# Patient Record
Sex: Male | Born: 1960 | Race: White | Hispanic: No | Marital: Married | Smoking: Current every day smoker
Health system: Southern US, Community
[De-identification: ages and names within clinical notes are randomized; demographics above are authoritative.]

## PROBLEM LIST (undated history)

## (undated) DIAGNOSIS — E559 Vitamin D deficiency, unspecified: Secondary | ICD-10-CM

## (undated) DIAGNOSIS — E7800 Pure hypercholesterolemia, unspecified: Secondary | ICD-10-CM

## (undated) DIAGNOSIS — F419 Anxiety disorder, unspecified: Secondary | ICD-10-CM

## (undated) DIAGNOSIS — S86019A Strain of unspecified Achilles tendon, initial encounter: Secondary | ICD-10-CM

## (undated) DIAGNOSIS — K219 Gastro-esophageal reflux disease without esophagitis: Secondary | ICD-10-CM

## (undated) DIAGNOSIS — J302 Other seasonal allergic rhinitis: Secondary | ICD-10-CM

## (undated) DIAGNOSIS — G4730 Sleep apnea, unspecified: Secondary | ICD-10-CM

---
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
## 2011-03-04 HISTORY — PX: TUMOR REMOVAL: SHX12

## 2011-03-04 HISTORY — PX: ESOPHAGOGASTRODUODENOSCOPY ENDOSCOPY: SHX5814

## 2013-01-01 HISTORY — PX: COLONOSCOPY: SHX174

## 2013-01-31 DIAGNOSIS — S86019A Strain of unspecified Achilles tendon, initial encounter: Secondary | ICD-10-CM

## 2013-01-31 HISTORY — DX: Strain of unspecified achilles tendon, initial encounter: S86.019A

## 2013-01-31 HISTORY — PX: WISDOM TOOTH EXTRACTION: SHX21

## 2013-03-01 ENCOUNTER — Encounter (HOSPITAL_COMMUNITY): Payer: Self-pay | Admitting: Pharmacy Technician

## 2013-03-01 ENCOUNTER — Other Ambulatory Visit (HOSPITAL_COMMUNITY): Payer: Self-pay

## 2013-03-01 NOTE — Progress Notes (Signed)
LOV note 02/26/13 Bulla PA-C on chart, sleep test 12/14/11 on chart

## 2013-03-01 NOTE — Patient Instructions (Addendum)
20 Coulton Schlink  03/01/2013   Your procedure is scheduled on: 03/04/13  Report to [facility] at 10:00 AM.  Call this number if you have problems the morning of surgery [phone]   Remember:   Do not eat food or drink liquids After Midnight.     Take these medicines the morning of surgery with A SIP OF WATER: omeprazole, lorazepam if needed, augmentin, flonase   Do not wear jewelry, make-up or nail polish.  Do not wear lotions, powders, or perfumes. You may wear deodorant.  Do not shave 48 hours prior to surgery. Men may shave face and neck.  Do not bring valuables to the hospital.  Contacts, dentures or bridgework may not be worn into surgery.  Leave suitcase in the car. After surgery it may be brought to your room.  For patients admitted to the hospital, checkout time is 11:00 AM the day of discharge.    Please read over the following fact sheets that you were given: incentive spirometry fact sheet Birdie Sons, RN  pre op nurse call if needed [phone]    FAILURE TO FOLLOW THESE INSTRUCTIONS MAY RESULT IN CANCELLATION OF YOUR SURGERY   Patient Signature: ___________________________________________

## 2013-03-02 ENCOUNTER — Encounter (HOSPITAL_COMMUNITY)
Admission: RE | Admit: 2013-03-02 | Discharge: 2013-03-02 | Disposition: A | Discharge status: Home / Self Care | Payer: BC Managed Care – PPO | Source: Ambulatory Visit | Attending: Orthopedic Surgery | Admitting: Orthopedic Surgery

## 2013-03-02 ENCOUNTER — Encounter (HOSPITAL_COMMUNITY): Payer: Self-pay

## 2013-03-02 ENCOUNTER — Ambulatory Visit (HOSPITAL_COMMUNITY)
Admission: RE | Admit: 2013-03-02 | Discharge: 2013-03-02 | Disposition: A | Discharge status: Home / Self Care | Payer: BC Managed Care – PPO | Source: Ambulatory Visit | Attending: Surgical | Admitting: Surgical

## 2013-03-02 DIAGNOSIS — Z01812 Encounter for preprocedural laboratory examination: Secondary | ICD-10-CM | POA: Insufficient documentation

## 2013-03-02 DIAGNOSIS — Z01810 Encounter for preprocedural cardiovascular examination: Secondary | ICD-10-CM | POA: Insufficient documentation

## 2013-03-02 DIAGNOSIS — IMO0002 Reserved for concepts with insufficient information to code with codable children: Secondary | ICD-10-CM | POA: Insufficient documentation

## 2013-03-02 DIAGNOSIS — Z01818 Encounter for other preprocedural examination: Secondary | ICD-10-CM | POA: Insufficient documentation

## 2013-03-02 HISTORY — DX: Sleep apnea, unspecified: G47.30

## 2013-03-02 HISTORY — DX: Strain of unspecified achilles tendon, initial encounter: S86.019A

## 2013-03-02 HISTORY — DX: Vitamin D deficiency, unspecified: E55.9

## 2013-03-02 HISTORY — DX: Anxiety disorder, unspecified: F41.9

## 2013-03-02 HISTORY — DX: Pure hypercholesterolemia, unspecified: E78.00

## 2013-03-02 HISTORY — DX: Gastro-esophageal reflux disease without esophagitis: K21.9

## 2013-03-02 HISTORY — DX: Other seasonal allergic rhinitis: J30.2

## 2013-03-02 LAB — URINALYSIS, ROUTINE W REFLEX MICROSCOPIC
Bilirubin Urine: NEGATIVE
Glucose, UA: NEGATIVE mg/dL
Hgb urine dipstick: NEGATIVE
Ketones, ur: NEGATIVE mg/dL
Leukocytes, UA: NEGATIVE
Nitrite: NEGATIVE
Protein, ur: NEGATIVE mg/dL
Specific Gravity, Urine: 1.007 (ref 1.005–1.030)
Urobilinogen, UA: 0.2 mg/dL (ref 0.0–1.0)
pH: 6 (ref 5.0–8.0)

## 2013-03-02 LAB — CBC
HCT: 46.8 % (ref 39.0–52.0)
MCH: 31.5 pg (ref 26.0–34.0)
MCHC: 33.3 g/dL (ref 30.0–36.0)
MCV: 94.4 fL (ref 78.0–100.0)
Platelets: 216 10*3/uL (ref 150–400)
RBC: 4.96 MIL/uL (ref 4.22–5.81)
RDW: 14.1 % (ref 11.5–15.5)
WBC: 9.2 10*3/uL (ref 4.0–10.5)

## 2013-03-02 LAB — COMPREHENSIVE METABOLIC PANEL
ALT: 49 U/L (ref 0–53)
AST: 25 U/L (ref 0–37)
Albumin: 3.5 g/dL (ref 3.5–5.2)
Alkaline Phosphatase: 100 U/L (ref 39–117)
BUN: 12 mg/dL (ref 6–23)
CO2: 27 mEq/L (ref 19–32)
Calcium: 9.3 mg/dL (ref 8.4–10.5)
Chloride: 101 mEq/L (ref 96–112)
Creatinine, Ser: 0.82 mg/dL (ref 0.50–1.35)
GFR calc Af Amer: >90 mL/min (ref >90)
GFR calc non Af Amer: >90 mL/min (ref >90)
Glucose, Bld: 115 mg/dL — ABNORMAL HIGH (ref 70–99)
Potassium: 4.3 mEq/L (ref 3.7–5.3)
Sodium: 138 mEq/L (ref 137–147)
Total Bilirubin: 0.3 mg/dL (ref 0.3–1.2)
Total Protein: 7.1 g/dL (ref 6.0–8.3)

## 2013-03-02 LAB — PROTIME-INR
INR: 0.93 (ref 0.00–1.49)
Prothrombin Time: 12.3 seconds (ref 11.6–15.2)

## 2013-03-02 LAB — APTT: aPTT: 28 seconds (ref 24–37)

## 2013-03-02 NOTE — H&P (Signed)
Bryan Orozco is an 52 y.o. male.   Chief Complaint: left ankle pain HPI: Bryan Orozco is a 52 year old male who presented with the chief complaint of left ankle pain. He reports that he starting having pain 2 weeks ago after taking a fall off of steps. He has noticed significant pain and swelling over the left ankle. He has increased discomfort with weightbearing. X-rays showed avulsion fracture off the medial malleolus and clinical exam showed a clear Achilles tendon rupture.   Past Medical History  Diagnosis Date  . Seasonal allergies   . Vitamin D deficiency   . High cholesterol   . Anxiety   . Sleep apnea     severe   . GERD (gastroesophageal reflux disease)   . Achilles tendon rupture 01/2013    with heel fracture, left    Past Surgical History  Procedure Laterality Date  . Tonsillectomy  1972  . Wisdom tooth extraction  01/2013    with bone graft  . Knee surgery Left 1979    torn ligament repair  . 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 Esophagogastroduodenoscopy endoscopy  2013  . Colonoscopy  01/2013   Social History:  reports that he has been smoking Cigarettes.  He has a 35 pack-year smoking history. He has never used smokeless tobacco. He reports that he drinks alcohol. He reports that he does not use illicit drugs.  Allergies: No Known Allergies  No prescriptions prior to admission    Results for orders placed during the hospital encounter of 03/02/13 (from the past 48 hour(s))  URINALYSIS, ROUTINE W REFLEX MICROSCOPIC     Status: None   Collection Time    03/02/13  8:56 AM      Result Value Range   Color, Urine YELLOW  YELLOW   APPearance CLEAR  CLEAR   Specific Gravity, Urine 1.007  1.005 - 1.030   pH 6.0  5.0 - 8.0   Glucose, UA NEGATIVE  NEGATIVE mg/dL   Hgb urine dipstick NEGATIVE  NEGATIVE   Bilirubin Urine NEGATIVE  NEGATIVE   Ketones, ur NEGATIVE  NEGATIVE mg/dL   Protein, ur NEGATIVE  NEGATIVE mg/dL   Urobilinogen, UA 0.2  0.0 - 1.0 mg/dL   Nitrite  NEGATIVE  NEGATIVE   Leukocytes, UA NEGATIVE  NEGATIVE   Comment: MICROSCOPIC NOT DONE ON URINES WITH NEGATIVE PROTEIN, BLOOD, LEUKOCYTES, NITRITE, OR GLUCOSE <1000 mg/dL.  CBC     Status: None   Collection Time    03/02/13  9:30 AM      Result Value Range   WBC 9.2  4.0 - 10.5 K/uL   RBC 4.96  4.22 - 5.81 MIL/uL   Hemoglobin 15.6  13.0 - 17.0 g/dL   HCT 16.1  09.6 - 04.5 %   MCV 94.4  78.0 - 100.0 fL   MCH 31.5  26.0 - 34.0 pg   MCHC 33.3  30.0 - 36.0 g/dL   RDW 40.9  81.1 - 91.4 %   Platelets 216  150 - 400 K/uL  APTT     Status: None   Collection Time    03/02/13  9:30 AM      Result Value Range   aPTT 28  24 - 37 seconds  COMPREHENSIVE METABOLIC PANEL     Status: Abnormal   Collection Time    03/02/13  9:30 AM      Result Value Range   Sodium 138  137 - 147 mEq/L  Comment: Please note change in reference range.   Potassium 4.3  3.7 - 5.3 mEq/L   Comment: Please note change in reference range.   Chloride 101  96 - 112 mEq/L   CO2 27  19 - 32 mEq/L   Glucose, Bld 115 (*) 70 - 99 mg/dL   BUN 12  6 - 23 mg/dL   Creatinine, Ser 2.95  0.50 - 1.35 mg/dL   Calcium 9.3  8.4 - 62.1 mg/dL   Total Protein 7.1  6.0 - 8.3 g/dL   Albumin 3.5  3.5 - 5.2 g/dL   AST 25  0 - 37 U/L   ALT 49  0 - 53 U/L   Alkaline Phosphatase 100  39 - 117 U/L   Total Bilirubin 0.3  0.3 - 1.2 mg/dL   GFR calc non Af Amer >90  >90 mL/min   GFR calc Af Amer >90  >90 mL/min   Comment: (NOTE)     The eGFR has been calculated using the CKD EPI equation.     This calculation has not been validated in all clinical situations.     eGFR's persistently <90 mL/min signify possible Chronic Kidney     Disease.  PROTIME-INR     Status: None   Collection Time    03/02/13  9:30 AM      Result Value Range   Prothrombin Time 12.3  11.6 - 15.2 seconds   INR 0.93  0.00 - 1.49   Dg Chest 2 View  03/02/2013   CLINICAL DATA:  Preoperative examination (Achilles tendon surgery)  EXAM: CHEST  2 VIEW  COMPARISON:   None.  FINDINGS: Normal cardiac silhouette and mediastinal contours. Apparent mild lung hyperexpansion may be artifactual secondary to mildly accentuated thoracic kyphosis. Minimal bilateral infrahilar opacities favored to represent atelectasis. No discrete focal airspace opacities. No definite pleural effusion or pneumothorax. No definite evidence of edema. Mild-to-moderate multilevel DDD within the mid and caudal aspect of the thoracic spine.  IMPRESSION: Minimal perihilar atelectasis without acute cardiopulmonary disease.   Electronically Signed   By: Simonne Come M.D.   On: 03/02/2013 09:49    Review of Systems  Constitutional: Negative.   HENT: Negative.   Eyes: Negative.   Respiratory: Negative.   Cardiovascular: Negative.   Gastrointestinal: Negative.   Genitourinary: Negative.   Musculoskeletal: Positive for falls, joint pain and myalgias. Negative for back pain and neck pain.  Skin: Negative.   Psychiatric/Behavioral: Negative.    Vitals Weight: 260 lb Height: 72 in Body Surface Area: 2.45 m Body Mass Index: 35.26 kg/m Pulse: 95 (Regular) BP: 150/90 (Sitting, Left Arm, Standard)  Physical Exam  Constitutional: He is oriented to person, place, and time. He appears well-developed and well-nourished. No distress.  HENT:  Head: Normocephalic and atraumatic.  Right Ear: External ear normal.  Left Ear: External ear normal.  Nose: Nose normal.  Mouth/Throat: Oropharynx is clear and moist.  Eyes: Conjunctivae and EOM are normal.  Neck: Normal range of motion. Neck supple.  Cardiovascular: Normal rate, regular rhythm, normal heart sounds and intact distal pulses.   Respiratory: Effort normal and breath sounds normal.  GI: Soft. Bowel sounds are normal.  Musculoskeletal:       Right knee: Normal.       Left knee: Normal.       Right ankle: Normal.       Left ankle: He exhibits decreased range of motion and swelling. Tenderness. Medial malleolus tenderness found. No  lateral malleolus tenderness  found. Achilles tendon exhibits pain, defect and abnormal Thompson's test results.       Right lower leg: He exhibits no tenderness and no swelling.       Left lower leg: He exhibits no tenderness and no swelling.  Neurological: He is alert and oriented to person, place, and time. No sensory deficit.  Skin: No rash noted. He is not diaphoretic. No erythema.  Psychiatric: He has a normal mood and affect. His behavior is normal.     Assessment/Plan Left Achilles tendon rupture He needs an open left Achilles tendon repair with possible use of graft and anchors. Risks and benefits of the procedure were discussed with the patient by Dr. Darrelyn Hillock.    Tamitha Norell LAUREN 03/02/2013, 1:37 PM

## 2013-03-04 ENCOUNTER — Encounter (HOSPITAL_COMMUNITY)
Admission: RE | Disposition: A | Discharge status: Home / Self Care | Payer: Self-pay | Source: Ambulatory Visit | Attending: Orthopedic Surgery

## 2013-03-04 ENCOUNTER — Encounter (HOSPITAL_COMMUNITY): Payer: Self-pay | Admitting: *Deleted

## 2013-03-04 ENCOUNTER — Ambulatory Visit (HOSPITAL_COMMUNITY): Payer: BC Managed Care – PPO | Admitting: Anesthesiology

## 2013-03-04 ENCOUNTER — Observation Stay (HOSPITAL_COMMUNITY)
Admission: RE | Admit: 2013-03-04 | Discharge: 2013-03-05 | Disposition: A | Discharge status: Home / Self Care | Payer: BC Managed Care – PPO | Source: Ambulatory Visit | Attending: Orthopedic Surgery | Admitting: Orthopedic Surgery

## 2013-03-04 ENCOUNTER — Encounter (HOSPITAL_COMMUNITY): Payer: BC Managed Care – PPO | Admitting: Anesthesiology

## 2013-03-04 DIAGNOSIS — K219 Gastro-esophageal reflux disease without esophagitis: Secondary | ICD-10-CM | POA: Insufficient documentation

## 2013-03-04 DIAGNOSIS — S96819A Strain of other specified muscles and tendons at ankle and foot level, unspecified foot, initial encounter: Principal | ICD-10-CM

## 2013-03-04 DIAGNOSIS — S93499A Sprain of other ligament of unspecified ankle, initial encounter: Principal | ICD-10-CM | POA: Insufficient documentation

## 2013-03-04 DIAGNOSIS — S86009A Unspecified injury of unspecified Achilles tendon, initial encounter: Secondary | ICD-10-CM

## 2013-03-04 DIAGNOSIS — X58XXXA Exposure to other specified factors, initial encounter: Secondary | ICD-10-CM | POA: Insufficient documentation

## 2013-03-04 DIAGNOSIS — S86002A Unspecified injury of left Achilles tendon, initial encounter: Secondary | ICD-10-CM

## 2013-03-04 DIAGNOSIS — F17200 Nicotine dependence, unspecified, uncomplicated: Secondary | ICD-10-CM | POA: Insufficient documentation

## 2013-03-04 DIAGNOSIS — G4730 Sleep apnea, unspecified: Secondary | ICD-10-CM | POA: Insufficient documentation

## 2013-03-04 HISTORY — PX: ACHILLES TENDON SURGERY: SHX542

## 2013-03-04 SURGERY — REPAIR, TENDON, ACHILLES
Anesthesia: General | Site: Leg Lower | Laterality: Left

## 2013-03-04 MED ORDER — MIDAZOLAM HCL 5 MG/5ML IJ SOLN
INTRAMUSCULAR | Status: DC | PRN
  Administered 2013-03-04: 2 mg via INTRAVENOUS

## 2013-03-04 MED ORDER — METHOCARBAMOL 100 MG/ML IJ SOLN
500.0000 mg | Freq: Four times a day (QID) | INTRAVENOUS | Status: DC | PRN
  Administered 2013-03-04: 500 mg via INTRAVENOUS
  Filled 2013-03-04: qty 5

## 2013-03-04 MED ORDER — HYDROMORPHONE HCL PF 1 MG/ML IJ SOLN: 0.5000 mg | INTRAMUSCULAR | Status: DC | PRN

## 2013-03-04 MED ORDER — METOPROLOL TARTRATE 1 MG/ML IV SOLN
INTRAVENOUS | Status: DC | PRN
  Administered 2013-03-04 (×4): 1 mg via INTRAVENOUS

## 2013-03-04 MED ORDER — BISACODYL 10 MG RE SUPP: 10.0000 mg | Freq: Every day | RECTAL | Status: DC | PRN

## 2013-03-04 MED ORDER — FERROUS SULFATE 325 (65 FE) MG PO TABS
325.0000 mg | ORAL_TABLET | Freq: Three times a day (TID) | ORAL | Status: DC
  Administered 2013-03-04 – 2013-03-05 (×3): 325 mg via ORAL
  Filled 2013-03-04 (×5): qty 1

## 2013-03-04 MED ORDER — PROPOFOL 10 MG/ML IV BOLUS
INTRAVENOUS | Status: AC
  Filled 2013-03-04: qty 20

## 2013-03-04 MED ORDER — HYDROMORPHONE HCL PF 1 MG/ML IJ SOLN
INTRAMUSCULAR | Status: AC
  Filled 2013-03-04: qty 1

## 2013-03-04 MED ORDER — BACITRACIN-NEOMYCIN-POLYMYXIN 400-5-5000 EX OINT
TOPICAL_OINTMENT | CUTANEOUS | Status: DC | PRN
  Administered 2013-03-04: 1 via TOPICAL

## 2013-03-04 MED ORDER — DEXAMETHASONE SODIUM PHOSPHATE 10 MG/ML IJ SOLN
INTRAMUSCULAR | Status: DC | PRN
  Administered 2013-03-04: 10 mg via INTRAVENOUS

## 2013-03-04 MED ORDER — LORAZEPAM 1 MG PO TABS: 1.0000 mg | ORAL_TABLET | Freq: Three times a day (TID) | ORAL | Status: DC | PRN

## 2013-03-04 MED ORDER — PANTOPRAZOLE SODIUM 40 MG PO TBEC
40.0000 mg | DELAYED_RELEASE_TABLET | Freq: Every day | ORAL | Status: DC
Start: 2013-03-04 — End: 2013-03-05
  Administered 2013-03-04 – 2013-03-05 (×2): 40 mg via ORAL
  Filled 2013-03-04 (×2): qty 1

## 2013-03-04 MED ORDER — HYDROMORPHONE HCL PF 1 MG/ML IJ SOLN
0.2500 mg | INTRAMUSCULAR | Status: DC | PRN
  Administered 2013-03-04 (×4): 0.5 mg via INTRAVENOUS

## 2013-03-04 MED ORDER — NEOSTIGMINE METHYLSULFATE 1 MG/ML IJ SOLN
INTRAMUSCULAR | Status: DC | PRN
  Administered 2013-03-04: 4 mg via INTRAVENOUS

## 2013-03-04 MED ORDER — CELECOXIB 200 MG PO CAPS
200.0000 mg | ORAL_CAPSULE | Freq: Two times a day (BID) | ORAL | Status: DC
  Administered 2013-03-04: 22:00:00 200 mg via ORAL
  Filled 2013-03-04 (×3): qty 1

## 2013-03-04 MED ORDER — CISATRACURIUM BESYLATE (PF) 10 MG/5ML IV SOLN
INTRAVENOUS | Status: DC | PRN
  Administered 2013-03-04: 5 mg via INTRAVENOUS

## 2013-03-04 MED ORDER — LACTATED RINGERS IV SOLN: INTRAVENOUS | Status: DC

## 2013-03-04 MED ORDER — PROPOFOL 10 MG/ML IV BOLUS
INTRAVENOUS | Status: DC | PRN
  Administered 2013-03-04: 200 mg via INTRAVENOUS

## 2013-03-04 MED ORDER — 0.9 % SODIUM CHLORIDE (POUR BTL) OPTIME
TOPICAL | Status: DC | PRN
  Administered 2013-03-04: 200 mL

## 2013-03-04 MED ORDER — RIVAROXABAN 10 MG PO TABS
10.0000 mg | ORAL_TABLET | Freq: Every day | ORAL | Status: DC
  Administered 2013-03-05: 11:00:00 10 mg via ORAL
  Filled 2013-03-04: qty 1

## 2013-03-04 MED ORDER — SUCCINYLCHOLINE CHLORIDE 20 MG/ML IJ SOLN
INTRAMUSCULAR | Status: AC
  Filled 2013-03-04: qty 1

## 2013-03-04 MED ORDER — MIDAZOLAM HCL 2 MG/2ML IJ SOLN
INTRAMUSCULAR | Status: AC
  Filled 2013-03-04: qty 2

## 2013-03-04 MED ORDER — CHLORHEXIDINE GLUCONATE 0.12 % MT SOLN: 15.0000 mL | Freq: Two times a day (BID) | OROMUCOSAL | Status: DC

## 2013-03-04 MED ORDER — CEFAZOLIN SODIUM-DEXTROSE 2-3 GM-% IV SOLR
INTRAVENOUS | Status: AC
  Filled 2013-03-04: qty 50

## 2013-03-04 MED ORDER — SODIUM CHLORIDE 0.9 % IR SOLN
Status: DC | PRN
  Administered 2013-03-04: 13:00:00

## 2013-03-04 MED ORDER — POLYETHYLENE GLYCOL 3350 17 G PO PACK: 17.0000 g | PACK | Freq: Every day | ORAL | Status: DC | PRN

## 2013-03-04 MED ORDER — HYDROMORPHONE HCL PF 1 MG/ML IJ SOLN
INTRAMUSCULAR | Status: DC | PRN
  Administered 2013-03-04: 1 mg via INTRAVENOUS

## 2013-03-04 MED ORDER — GLYCOPYRROLATE 0.2 MG/ML IJ SOLN
INTRAMUSCULAR | Status: DC | PRN
  Administered 2013-03-04: 0.6 mg via INTRAVENOUS

## 2013-03-04 MED ORDER — HYDROCODONE-ACETAMINOPHEN 5-325 MG PO TABS: 1.0000 | ORAL_TABLET | ORAL | Status: DC | PRN

## 2013-03-04 MED ORDER — KETOROLAC TROMETHAMINE 30 MG/ML IJ SOLN
15.0000 mg | Freq: Once | INTRAMUSCULAR | Status: AC | PRN
  Administered 2013-03-04: 30 mg via INTRAVENOUS

## 2013-03-04 MED ORDER — LACTATED RINGERS IV SOLN
INTRAVENOUS | Status: DC
  Administered 2013-03-04 (×3): via INTRAVENOUS

## 2013-03-04 MED ORDER — KETOROLAC TROMETHAMINE 30 MG/ML IJ SOLN
INTRAMUSCULAR | Status: AC
  Filled 2013-03-04: qty 1

## 2013-03-04 MED ORDER — PROMETHAZINE HCL 25 MG/ML IJ SOLN: 6.2500 mg | INTRAMUSCULAR | Status: DC | PRN

## 2013-03-04 MED ORDER — ONDANSETRON HCL 4 MG/2ML IJ SOLN
INTRAMUSCULAR | Status: DC | PRN
  Administered 2013-03-04: 4 mg via INTRAVENOUS

## 2013-03-04 MED ORDER — CEFAZOLIN SODIUM-DEXTROSE 2-3 GM-% IV SOLR
2.0000 g | INTRAVENOUS | Status: AC
  Administered 2013-03-04: 2 g via INTRAVENOUS

## 2013-03-04 MED ORDER — BACITRACIN-NEOMYCIN-POLYMYXIN 400-5-5000 EX OINT
TOPICAL_OINTMENT | CUTANEOUS | Status: AC
  Filled 2013-03-04: qty 1

## 2013-03-04 MED ORDER — ONDANSETRON HCL 4 MG/2ML IJ SOLN
INTRAMUSCULAR | Status: AC
  Filled 2013-03-04: qty 2

## 2013-03-04 MED ORDER — CISATRACURIUM BESYLATE 20 MG/10ML IV SOLN
INTRAVENOUS | Status: AC
  Filled 2013-03-04: qty 10

## 2013-03-04 MED ORDER — ONDANSETRON HCL 4 MG PO TABS: 4.0000 mg | ORAL_TABLET | Freq: Four times a day (QID) | ORAL | Status: DC | PRN

## 2013-03-04 MED ORDER — LIDOCAINE HCL (CARDIAC) 20 MG/ML IV SOLN
INTRAVENOUS | Status: DC | PRN
  Administered 2013-03-04: 100 mg via INTRAVENOUS

## 2013-03-04 MED ORDER — CEFAZOLIN SODIUM 1-5 GM-% IV SOLN
1.0000 g | Freq: Four times a day (QID) | INTRAVENOUS | Status: AC
  Administered 2013-03-04 – 2013-03-05 (×3): 1 g via INTRAVENOUS
  Filled 2013-03-04 (×3): qty 50

## 2013-03-04 MED ORDER — FENTANYL CITRATE 0.05 MG/ML IJ SOLN
INTRAMUSCULAR | Status: AC
  Filled 2013-03-04: qty 5

## 2013-03-04 MED ORDER — METHOCARBAMOL 500 MG PO TABS
500.0000 mg | ORAL_TABLET | Freq: Four times a day (QID) | ORAL | Status: DC | PRN
  Administered 2013-03-04 – 2013-03-05 (×3): 500 mg via ORAL
  Filled 2013-03-04 (×3): qty 1

## 2013-03-04 MED ORDER — BUPIVACAINE LIPOSOME 1.3 % IJ SUSP
20.0000 mL | Freq: Once | INTRAMUSCULAR | Status: AC
Start: 2013-03-04 — End: 2013-03-04
  Administered 2013-03-04: 8 mL
  Filled 2013-03-04: qty 20

## 2013-03-04 MED ORDER — ONDANSETRON HCL 4 MG/2ML IJ SOLN: 4.0000 mg | Freq: Four times a day (QID) | INTRAMUSCULAR | Status: DC | PRN

## 2013-03-04 MED ORDER — OXYCODONE-ACETAMINOPHEN 5-325 MG PO TABS
2.0000 | ORAL_TABLET | ORAL | Status: DC | PRN
  Administered 2013-03-04 (×2): 2 via ORAL
  Administered 2013-03-04: 1 via ORAL
  Administered 2013-03-05 (×3): 2 via ORAL
  Filled 2013-03-04 (×5): qty 2

## 2013-03-04 MED ORDER — SIMVASTATIN 10 MG PO TABS
10.0000 mg | ORAL_TABLET | Freq: Every day | ORAL | Status: DC
  Administered 2013-03-04: 10 mg via ORAL
  Filled 2013-03-04 (×2): qty 1

## 2013-03-04 MED ORDER — NICOTINE 21 MG/24HR TD PT24
21.0000 mg | MEDICATED_PATCH | Freq: Every day | TRANSDERMAL | Status: DC
  Administered 2013-03-04: 18:00:00 21 mg via TRANSDERMAL
  Filled 2013-03-04 (×2): qty 1

## 2013-03-04 MED ORDER — FLUTICASONE PROPIONATE 50 MCG/ACT NA SUSP: 1.0000 | Freq: Every evening | NASAL | Status: DC | PRN

## 2013-03-04 MED ORDER — FENTANYL CITRATE 0.05 MG/ML IJ SOLN
INTRAMUSCULAR | Status: DC | PRN
  Administered 2013-03-04: 100 ug via INTRAVENOUS
  Administered 2013-03-04: 50 ug via INTRAVENOUS
  Administered 2013-03-04: 150 ug via INTRAVENOUS
  Administered 2013-03-04: 50 ug via INTRAVENOUS

## 2013-03-04 MED ORDER — SUCCINYLCHOLINE CHLORIDE 20 MG/ML IJ SOLN
INTRAMUSCULAR | Status: DC | PRN
  Administered 2013-03-04: 100 mg via INTRAVENOUS

## 2013-03-04 MED ORDER — LIDOCAINE HCL (CARDIAC) 20 MG/ML IV SOLN
INTRAVENOUS | Status: AC
  Filled 2013-03-04: qty 5

## 2013-03-04 MED ORDER — DEXAMETHASONE SODIUM PHOSPHATE 10 MG/ML IJ SOLN
INTRAMUSCULAR | Status: AC
  Filled 2013-03-04: qty 1

## 2013-03-04 SURGICAL SUPPLY — 34 items
BAG ZIPLOCK 12X15 (MISCELLANEOUS) IMPLANT
BIT DRILL 2.8X128 (BIT) IMPLANT
BIT DRILL 2.8X128MM (BIT)
BLADE SURG SZ10 CARB STEEL (BLADE) ×3 IMPLANT
DRSG EMULSION OIL 3X3 NADH (GAUZE/BANDAGES/DRESSINGS) ×3 IMPLANT
DURAPREP 26ML APPLICATOR (WOUND CARE) ×3 IMPLANT
ELECT REM PT RETURN 9FT ADLT (ELECTROSURGICAL) ×3
ELECTRODE REM PT RTRN 9FT ADLT (ELECTROSURGICAL) ×1 IMPLANT
GLOVE BIOGEL PI IND STRL 8 (GLOVE) ×1 IMPLANT
GLOVE BIOGEL PI INDICATOR 8 (GLOVE) ×2
GLOVE ECLIPSE 8.0 STRL XLNG CF (GLOVE) ×3 IMPLANT
GOWN PREVENTION PLUS LG XLONG (DISPOSABLE) ×3 IMPLANT
GOWN STRL REIN XL XLG (GOWN DISPOSABLE) ×6 IMPLANT
MANIFOLD NEPTUNE II (INSTRUMENTS) ×3 IMPLANT
NS IRRIG 1000ML POUR BTL (IV SOLUTION) ×3 IMPLANT
PACK LOWER EXTREMITY WL (CUSTOM PROCEDURE TRAY) ×3 IMPLANT
PAD ABD 8X10 STRL (GAUZE/BANDAGES/DRESSINGS) ×3 IMPLANT
PAD CAST 4YDX4 CTTN HI CHSV (CAST SUPPLIES) ×4 IMPLANT
PADDING CAST COTTON 4X4 STRL (CAST SUPPLIES) ×8
PADDING CAST COTTON 6X4 STRL (CAST SUPPLIES) IMPLANT
PASSER SUT SWANSON 36MM LOOP (INSTRUMENTS) IMPLANT
PATCH TISSUE MEND 5X6CM (Orthopedic Implant) ×3 IMPLANT
POSITIONER SURGICAL ARM (MISCELLANEOUS) IMPLANT
SCOTCHCAST PLUS 3X4 WHITE (CAST SUPPLIES) ×9 IMPLANT
SCOTCHCAST PLUS 4X4 WHITE (CAST SUPPLIES) IMPLANT
SPONGE GAUZE 4X4 12PLY (GAUZE/BANDAGES/DRESSINGS) ×3 IMPLANT
STAPLER VISISTAT (STAPLE) IMPLANT
SUT ETHILON 3 0 PS 1 (SUTURE) ×12 IMPLANT
SUT VIC AB 1 CT1 27 (SUTURE) ×2
SUT VIC AB 1 CT1 27XBRD ANTBC (SUTURE) ×1 IMPLANT
SUT VIC AB 2-0 CT1 27 (SUTURE) ×2
SUT VIC AB 2-0 CT1 TAPERPNT 27 (SUTURE) ×1 IMPLANT
TOWEL OR 17X26 10 PK STRL BLUE (TOWEL DISPOSABLE) ×6 IMPLANT
WATER STERILE IRR 1500ML POUR (IV SOLUTION) ×3 IMPLANT

## 2013-03-04 NOTE — Transfer of Care (Signed)
Immediate Anesthesia Transfer of Care Note  Patient: Bryan HeidelbergBrian Palladino  Procedure(s) Performed: Procedure(s) (LRB): LEFT ACHILLES TENDON REPAIR WITH TISSUE MEND GRAFT (Left)  Patient Location: PACU  Anesthesia Type: General  Level of Consciousness: sedated, patient cooperative and responds to stimulation  Airway & Oxygen Therapy: Patient Spontanous Breathing and Patient connected to face mask oxgen  Post-op Assessment: Report given to PACU RN and Post -op Vital signs reviewed and stable  Post vital signs: Reviewed and stable  Complications: No apparent anesthesia complications

## 2013-03-04 NOTE — Brief Op Note (Signed)
03/04/2013  1:50 PM  PATIENT:  Bryan HeidelbergBrian Orozco  53 y.o. male  PRE-OPERATIVE DIAGNOSIS:  Left Achilles Rupture  POST-OPERATIVE DIAGNOSIS:  Left Achilles Rupture  PROCEDURE:  Procedure(s): LEFT ACHILLES TENDON REPAIR WITH TISSUE MEND GRAFT (Left)  SURGEON:  Surgeon(s) and Role:    * Jacki Conesonald A Edmond Ginsberg, MD - Primary  :   ASSISTANTS: OR Tech   ANESTHESIA:   general  EBL:     BLOOD ADMINISTERED:none  DRAINS: none   LOCAL MEDICATIONS USED:  BUPIVICAINE 8cc   SPECIMEN:  No Specimen  DISPOSITION OF SPECIMEN:  N/A  COUNTS:  YES  TOURNIQUET:   Total Tourniquet Time Documented: Calf (Left) - 47 minutes Total: Calf (Left) - 47 minutes   DICTATION: .Other Dictation: Dictation Number [id number]  PLAN OF CARE: Admit for overnight observation  PATIENT DISPOSITION:  Stable in OR   Delay start of Pharmacological VTE agent (>24hrs) due to surgical blood loss or risk of bleeding: yes

## 2013-03-04 NOTE — Interval H&P Note (Signed)
History and Physical Interval Note:  03/04/2013 12:25 PM  Bryan Orozco  has presented today for surgery, with the diagnosis of Left Achilles Rupture  The various methods of treatment have been discussed with the patient and family. After consideration of risks, benefits and other options for treatment, the patient has consented to  Procedure(s): LEFT ACHILLES TENDON REPAIR (Left) as a surgical intervention .  The patient's history has been reviewed, patient examined, no change in status, stable for surgery.  I have reviewed the patient's chart and labs.  Questions were answered to the patient's satisfaction.     Keesha Pellum A

## 2013-03-04 NOTE — Preoperative (Signed)
Beta Blockers   Reason not to administer Beta Blockers:Not Applicable 

## 2013-03-04 NOTE — Anesthesia Postprocedure Evaluation (Signed)
  Anesthesia Post-op Note  Patient: Bryan Orozco  Procedure(s) Performed: Procedure(s) (LRB): LEFT ACHILLES TENDON REPAIR WITH TISSUE MEND GRAFT (Left)  Patient Location: PACU  Anesthesia Type: General  Level of Consciousness: awake and alert   Airway and Oxygen Therapy: Patient Spontanous Breathing  Post-op Pain: mild  Post-op Assessment: Post-op Vital signs reviewed, Patient's Cardiovascular Status Stable, Respiratory Function Stable, Patent Airway and No signs of Nausea or vomiting  Last Vitals:  Filed Vitals:   03/04/13 1400  BP: 153/94  Pulse: 95  Temp: 36.6 C  Resp: 14    Post-op Vital Signs: stable   Complications: No apparent anesthesia complications

## 2013-03-04 NOTE — Anesthesia Preprocedure Evaluation (Signed)
Anesthesia Evaluation  Patient identified by MRN, date of birth, ID band Patient awake    Reviewed: Allergy & Precautions, H&P , NPO status , Patient's Chart, lab work & pertinent test results  Airway Mallampati: III TM Distance: <3 FB Neck ROM: Full    Dental no notable dental hx.    Pulmonary sleep apnea , Current Smoker,  breath sounds clear to auscultation  Pulmonary exam normal       Cardiovascular negative cardio ROS  Rhythm:Regular Rate:Normal     Neuro/Psych negative neurological ROS  negative psych ROS   GI/Hepatic negative GI ROS, Neg liver ROS,   Endo/Other  Obesity  Renal/GU negative Renal ROS  negative genitourinary   Musculoskeletal negative musculoskeletal ROS (+)   Abdominal   Peds negative pediatric ROS (+)  Hematology negative hematology ROS (+)   Anesthesia Other Findings   Reproductive/Obstetrics negative OB ROS                           Anesthesia Physical Anesthesia Plan  ASA: III  Anesthesia Plan: General   Post-op Pain Management:    Induction: Intravenous  Airway Management Planned: Oral ETT  Additional Equipment:   Intra-op Plan:   Post-operative Plan: Extubation in OR  Informed Consent: I have reviewed the patients History and Physical, chart, labs and discussed the procedure including the risks, benefits and alternatives for the proposed anesthesia with the patient or authorized representative who has indicated his/her understanding and acceptance.   Dental advisory given  Plan Discussed with: CRNA and Surgeon  Anesthesia Plan Comments:         Anesthesia Quick Evaluation

## 2013-03-04 NOTE — Anesthesia Procedure Notes (Signed)
Procedure Name: Intubation Date/Time: 03/04/2013 12:37 PM Performed by: Bryan LibmanEARDON, Bryan [name] Patient Re-evaluated:Patient Re-evaluated prior to inductionOxygen Delivery Method: Circle system utilized Preoxygenation: Pre-oxygenation with 100% oxygen Intubation Type: IV induction Ventilation: Mask ventilation without difficulty and Oral airway inserted - appropriate to patient size Laryngoscope Size: Miller and 3 Grade View: Grade III Tube type: Oral Number of attempts: 1 Airway Equipment and Method: Stylet Placement Confirmation: ETT inserted through vocal cords under direct vision,  positive ETCO2 and breath sounds checked- equal and bilateral Secured at: 22 cm Tube secured with: Tape Dental Injury: Teeth and Oropharynx as per pre-operative assessment

## 2013-03-05 LAB — BASIC METABOLIC PANEL
BUN: 14 mg/dL (ref 6–23)
CALCIUM: 8.8 mg/dL (ref 8.4–10.5)
CO2: 29 mEq/L (ref 19–32)
CREATININE: 0.91 mg/dL (ref 0.50–1.35)
Chloride: 104 mEq/L (ref 96–112)
Glucose, Bld: 123 mg/dL — ABNORMAL HIGH (ref 70–99)
Potassium: 4.4 mEq/L (ref 3.7–5.3)
Sodium: 141 mEq/L (ref 137–147)

## 2013-03-05 MED ORDER — OXYCODONE-ACETAMINOPHEN 5-325 MG PO TABS: 2.0000 | ORAL_TABLET | ORAL | Status: AC | PRN

## 2013-03-05 MED ORDER — RIVAROXABAN 10 MG PO TABS: 10.0000 mg | ORAL_TABLET | Freq: Every day | ORAL | Status: AC

## 2013-03-05 NOTE — Progress Notes (Signed)
   Subjective: 1 Day Post-Op Procedure(s) (LRB): LEFT ACHILLES TENDON REPAIR WITH TISSUE MEND GRAFT (Left)  Pt doing well Minimal pain this morning Discussed use of walker vs crutches Patient reports pain as mild.  Objective:   VITALS:   Filed Vitals:   03/05/13 0500  BP: 122/79  Pulse: 73  Temp: 98 F (36.7 C)  Resp: 16    Left lower extremity in cast nv intact distally No rashes or sign of skin breakdown  LABS  Recent Labs  03/02/13 0930  HGB 15.6  HCT 46.8  WBC 9.2  PLT 216     Recent Labs  03/02/13 0930 03/05/13 0521  NA 138 141  K 4.3 4.4  BUN 12 14  CREATININE 0.82 0.91  GLUCOSE 115* 123*     Assessment/Plan: 1 Day Post-Op Procedure(s) (LRB): LEFT ACHILLES TENDON REPAIR WITH TISSUE MEND GRAFT (Left)  D/c home today F/u in 1-2 weeks Non weight bearing on left lower extremity    Alphonsa OverallBrad Renella Steig, MPAS, PA-C  03/05/2013, 7:18 AM

## 2013-03-05 NOTE — Progress Notes (Signed)
   CARE MANAGEMENT NOTE 03/05/2013  Patient:  Bryan Orozco,Bryan Orozco   Account Number:  [id number]  Date Initiated:  03/05/2013  Documentation initiated by:  [facility]Niajah Sipos  Subjective/Objective Assessment:   LEFT ACHILLES TENDON REPAIR WITH TISSUE MEND GRAFT on 03/04/2013     Action/Plan:   HH   Anticipated DC Date:  03/05/2013   Anticipated DC Plan:  HOME W HOSPICE CARE      DC Planning Services  CM consult      [facility]   Choice offered to / List presented to:  C-1 Patient   DME arranged  [facility]      DME agency  [facility]     HH arranged  HH-2 PT      [facility]  [facility]   Status of service:  Completed, signed off Medicare Important Message given?   (If response is "NO", the following Medicare IM given date fields will be blank) Date Medicare IM given:   Date Additional Medicare IM given:    Discharge Disposition:  HOME W HOME HEALTH SERVICES  Per UR Regulation:    If discussed at Long Length of Stay Meetings, dates discussed:    Comments:  03/05/2012 1500 NCM spoke to pt and [facility]. [facility] Ordered [facility] with [facility] and will deliver to room prior to dc. Spoke to General MillsBrad Dixon PA about muscle relaxant. Gave instructions to call [facility] with Robaxin Rx for 500 mg q 6 hours prn as needed for muscle spasms. [phone]. Faxed Xarelto Rx to pharmacy. Isidoro DonningAlesia Ata Pecha RN CCM Case Mgmt phone [phone]

## 2013-03-05 NOTE — Op Note (Signed)
NAMPriscille Heidelberg:  Drakes, Geza                ACCOUNT NO.:  [id number]  MEDICAL RECORD NO.:  [id number]  LOCATION:  1619                         [facility]  PHYSICIAN:  Georges Lynchonald A. Wylie Coon, M.D.DATE OF BIRTH:  Oct 19, 1960  DATE OF PROCEDURE:  03/04/2013 DATE OF DISCHARGE:                              OPERATIVE REPORT   SURGEON:  Georges Lynchonald A. Laurenashley Viar, MD.  ASSISTANT:  The OR tech.  PREOPERATIVE DIAGNOSIS:  Complete disruption of the Achilles tendon on the left.  POSTOPERATIVE DIAGNOSIS:  Complete disruption of the Achilles tendon on the left.  OPERATION:  Repair of the complete rupture of left Achilles tendon utilizing a TissueMend graft.  DESCRIPTION OF PROCEDURE:  Under general anesthesia, routine orthopedic prep and draping of the left lower extremity was carried out. Appropriate time-out was first carried out.  I marked the appropriate left leg in the holding area. At this time, the leg was exsanguinated and Esmarch tourniquet was elevated at 300 mmHg.  A longitudinal incision was made over the Achilles tendon.  He had a few little bleeders, we had to cauterize at this time.  I then identified the disruption of the tendon.  We cleaned the area up a little, debrided it, and then did a primary repair utilizing a TissueMend graft.  We used a large graft.  The graft was sutured in place and then we irrigated the area out, and closed the wound with 3-0 nylon suture.  Note, I injected about 8 ml of Exparel into the repair site.  Sterile Neosporin dressing was applied.  The patient was held down in plantar flexion, and the short-leg cast was applied, which was extremely well padded.  He was given 2 g of IV Ancef preop.  He will be admitted and kept overnight and he will be on Xarelto as a blood thinner for 2 weeks followed by aspirin.          ______________________________ Georges Lynchonald A. Darrelyn HillockGioffre, M.D.     RAG/MEDQ  D:  03/04/2013  T:  03/04/2013  Job:  [id number]

## 2013-03-05 NOTE — Discharge Summary (Signed)
Physician Discharge Summary   Patient ID: Bryan Orozco MRN: [id number] DOB/AGE: 52/08/1960 52 y.o.  Admit date: 03/04/2013 Discharge date: 03/05/2013  Admission Diagnoses:  Active Problems:   Achilles tendon injury   Discharge Diagnoses:  Same   Surgeries: Procedure(s): LEFT ACHILLES TENDON REPAIR WITH TISSUE MEND GRAFT on 03/04/2013   Consultants: PT/OT  Discharged Condition: Stable  Hospital Course: Bryan Orozco is an 53 y.o. male who was admitted 03/04/2013 with a chief complaint of No chief complaint on file. , and found to have a diagnosis of <principal problem not specified>.  They were brought to the operating room on 03/04/2013 and underwent the above named procedures.    The patient had an uncomplicated hospital course and was stable for discharge.  Recent vital signs:  Filed Vitals:   03/05/13 0500  BP: 122/79  Pulse: 73  Temp: 98 F (36.7 C)  Resp: 16    Recent laboratory studies:  Results for orders placed during the hospital encounter of 03/04/13  BASIC METABOLIC PANEL      Result Value Range   Sodium 141  137 - 147 mEq/L   Potassium 4.4  3.7 - 5.3 mEq/L   Chloride 104  96 - 112 mEq/L   CO2 29  19 - 32 mEq/L   Glucose, Bld 123 (*) 70 - 99 mg/dL   BUN 14  6 - 23 mg/dL   Creatinine, Ser 4.09  0.50 - 1.35 mg/dL   Calcium 8.8  8.4 - 81.1 mg/dL   GFR calc non Af Amer >90  >90 mL/min   GFR calc Af Amer >90  >90 mL/min    Discharge Medications:     Medication List         amoxicillin-clavulanate 875-125 MG per tablet  Commonly known as:  AUGMENTIN  Take 1 tablet by mouth 2 (two) times daily.     aspirin 325 MG tablet  Take 325 mg by mouth daily.     celecoxib 200 MG capsule  Commonly known as:  CELEBREX  Take 200 mg by mouth 2 (two) times daily.     chlorhexidine 0.12 % solution  Commonly known as:  PERIDEX  Use as directed 15 mLs in the mouth or throat 2 (two) times daily. Oral care after having molars removed.     fluticasone 50 MCG/ACT nasal  spray  Commonly known as:  FLONASE  Place 1 spray into both nostrils at bedtime as needed for allergies or rhinitis.     LORazepam 1 MG tablet  Commonly known as:  ATIVAN  Take 1 mg by mouth every 8 (eight) hours as needed for anxiety.     omeprazole 40 MG capsule  Commonly known as:  PRILOSEC  Take 40 mg by mouth daily.     oxyCODONE-acetaminophen 5-325 MG per tablet  Commonly known as:  PERCOCET/ROXICET  Take 2 tablets by mouth every 4 (four) hours as needed for severe pain.     pravastatin 20 MG tablet  Commonly known as:  PRAVACHOL  Take 20 mg by mouth daily with supper.     rivaroxaban 10 MG Tabs tablet  Commonly known as:  XARELTO  Take 1 tablet (10 mg total) by mouth daily.     Vitamin D3 2000 UNITS Tabs  Take 6,000 Units by mouth daily.        Diagnostic Studies: Dg Chest 2 View  03/02/2013   CLINICAL DATA:  Preoperative examination (Achilles tendon surgery)  EXAM: CHEST  2 VIEW  COMPARISON:  None.  FINDINGS: Normal cardiac silhouette and mediastinal contours. Apparent mild lung hyperexpansion may be artifactual secondary to mildly accentuated thoracic kyphosis. Minimal bilateral infrahilar opacities favored to represent atelectasis. No discrete focal airspace opacities. No definite pleural effusion or pneumothorax. No definite evidence of edema. Mild-to-moderate multilevel DDD within the mid and caudal aspect of the thoracic spine.  IMPRESSION: Minimal perihilar atelectasis without acute cardiopulmonary disease.   Electronically Signed   By: Simonne ComeJohn  Watts M.D.   On: 03/02/2013 09:49    Disposition: Final discharge disposition not confirmed      Discharge Orders   Future Orders Complete By Expires   Call MD / Call 911  As directed    Comments:     If you experience chest pain or shortness of breath, CALL 911 and be transported to the hospital emergency room.  If you develope a fever above 101 F, pus (white drainage) or increased drainage or redness at the wound, or  calf pain, call your surgeon's office.   Constipation Prevention  As directed    Comments:     Drink plenty of fluids.  Prune juice may be helpful.  You may use a stool softener, such as Colace (over the counter) 100 mg twice a day.  Use MiraLax (over the counter) for constipation as needed.   Diet - low sodium heart healthy  As directed    Increase activity slowly as tolerated  As directed          Signed: Bernisha Verma B 03/05/2013, 7:21 AM

## 2013-03-05 NOTE — Progress Notes (Signed)
Utilization Review completed.  

## 2013-03-05 NOTE — Evaluation (Signed)
Physical Therapy Evaluation Patient Details Name: Bryan HeidelbergBrian Henkels MRN: [id number] DOB: Jul 30, 1960 Today's Date: 03/05/2013 Time: 0926-1006 PT Time Calculation (min): 40 min  PT Assessment / Plan / Recommendation History of Present Illness  s/p  Left achilles tendon repair  Clinical Impression  Pt  Will benefit from PT  To address deficits below; pt will need RW for home and HHPT; will try knee walker later today; Pt has issues with home being under renovation, only has toilet in living area and no sink currently;    PT Assessment  Patient needs continued PT services    Follow Up Recommendations  Home health PT    Does the patient have the potential to tolerate intense rehabilitation      Barriers to Discharge        Equipment Recommendations  Rolling walker with 5" wheels;Wheelchair (measurements PT) (??pt may want w/c; will assess for knee walker)    Recommendations for Other Services     Frequency  (1-2 sessions)    Precautions / Restrictions Precautions Precautions: Fall Restrictions Weight Bearing Restrictions: Yes LLE Weight Bearing: Non weight bearing   Pertinent Vitals/Pain No c/o pain      Mobility  Bed Mobility Bed Mobility: Supine to Sit Supine to Sit: 6: Modified independent (Device/Increase time) Transfers Transfers: Sit to Stand;Stand to Sit Sit to Stand: 4: Min guard Stand to Sit: 4: Min guard Details for Transfer Assistance: cues for hand placement and safe technique Ambulation/Gait Ambulation/Gait Assistance: 4: Min assist Ambulation Distance (Feet): 45 Feet Assistive device: Rolling walker Ambulation/Gait Assistance Details: cues for sequence, NWB and technique Gait velocity: decr General Gait Details: slightly unsteady gait intially    Exercises     PT Diagnosis: Difficulty walking  PT Problem List: Decreased strength;Decreased range of motion;Decreased activity tolerance;Decreased balance;Decreased mobility;Decreased knowledge of use of  DME;Decreased knowledge of precautions PT Treatment Interventions: DME instruction;Gait training;Functional mobility training;Therapeutic activities;Patient/family education     PT Goals(Current goals can be found in the care plan section) Acute Rehab PT Goals Patient Stated Goal: I as possible PT Goal Formulation: With patient Time For Goal Achievement: 03/06/13 Potential to Achieve Goals: Good  Visit Information  Last PT Received On: 03/05/13 History of Present Illness: s/p  Left achilles tendon repair       Prior Functioning  Home Living Family/patient expects to be discharged to:: Private residence Living Arrangements: Spouse/significant other Type of Home: House Home Access: Stairs to enter Secretary/administratorntrance Stairs-Number of Steps: 0 Additional Comments: after 5pm will have wife; stepson will be there after 2:30 (has downs syndrome); has bathroom under construction; could use pool house but could use shower in therre 3 steps to enter with no rails Prior Function Level of Independence: Independent Communication Communication: No difficulties    Cognition  Cognition Arousal/Alertness: Awake/alert Behavior During Therapy: WFL for tasks assessed/performed Overall Cognitive Status: Within Functional Limits for tasks assessed    Extremity/Trunk Assessment Upper Extremity Assessment Upper Extremity Assessment: Overall WFL for tasks assessed Lower Extremity Assessment Lower Extremity Assessment: LLE deficits/detail LLE Deficits / Details: lower leg cast LLE: Unable to fully assess due to pain;Unable to fully assess due to immobilization   Balance    End of Session PT - End of Session Equipment Utilized During Treatment: Gait belt Activity Tolerance: Patient limited by fatigue Patient left: in chair;with call bell/phone within reach Nurse Communication: Mobility status  GP     Jackson SouthWILLIAMS,Cheila Wickstrom 03/05/2013, 10:21 AM

## 2013-03-05 NOTE — Progress Notes (Signed)
Discharged from floor via w/c, friends with pt. No changes in assessment. Tanny Harnack  

## 2013-03-05 NOTE — Progress Notes (Signed)
03/05/13 1300  PT Visit Information  Last PT Received On 03/05/13  Assistance Needed +1  History of Present Illness s/p  Left achilles tendon repair  PT Time Calculation  PT Start Time 1321  PT Stop Time 1346  PT Time Calculation (min) 25 min  Subjective Data  Subjective I am ready to go  Precautions  Precautions Fall  Restrictions  LLE Weight Bearing NWB  Cognition  Arousal/Alertness Awake/alert  Behavior During Therapy WFL for tasks assessed/performed  Overall Cognitive Status Within Functional Limits for tasks assessed  Bed Mobility  Bed Mobility Not assessed  Transfers  Transfers Sit to Stand;Stand to Sit  Sit to Stand 4: Min guard  Stand to Sit 4: Min guard  Details for Transfer Assistance cues for hand placement and safe technique to get to knee walker  Ambulation/Gait  Ambulation/Gait Assistance 4: Min guard;5: Supervision  Ambulation Distance (Feet) 65 Feet  Assistive device Other (Comment);Rolling walker (knee walker)  Ambulation/Gait Assistance Details cues for tehcnique  General Gait Details pt more steadya nd less SOB with use of knee walker; amb ~ 10' with RW and min/guard to close supervision; feels he can mobilize with RW well enough for now, interested inknee walker but unsure about cost; will defer to CM  Stairs No (pt declines practice, able to get into home without stairs)  PT - End of Session  Activity Tolerance Patient tolerated treatment well  Patient left in chair;with call bell/phone within reach;with family/visitor present  Nurse Communication Mobility status  PT - Assessment/Plan  PT Plan Current plan remains appropriate  Follow Up Recommendations Home health PT;Supervision - Intermittent;Supervision for mobility/OOB  PT equipment Rolling walker with 5" wheels (knee walker?? depending on coverage)  PT Goal Progression  Progress towards PT goals Progressing toward goals  Acute Rehab PT Goals  PT Goal Formulation With patient  Time For Goal  Achievement 03/06/13  Potential to Achieve Goals Good  PT G-Codes **NOT FOR INPATIENT CLASS**  Mobility: Walking and Moving Around Discharge Status ([id number]) CI  PT General Charges  $$ ACUTE PT VISIT 1 Procedure  PT Treatments  $Gait Training 23-37 mins

## 2013-03-05 NOTE — Progress Notes (Signed)
Home needs for pt recommended by PT. Patient also asking for robaxin Rx. Alphonsa OverallBrad Dixon, PA notified by phone & orders given, robaxin Rx to be done later by PA. Brylan Seubert, [employer] Bath [employer] Beyond[employer]

## 2013-03-07 ENCOUNTER — Encounter (HOSPITAL_COMMUNITY): Payer: Self-pay | Admitting: Orthopedic Surgery

## 2015-07-01 IMAGING — CR DG CHEST 2V
2 series · 2 of 2 positions shown · non-contrast
Comparison: None.

CLINICAL DATA: Preoperative examination (Achilles tendon surgery)

EXAM:
CHEST  2 VIEW

[w chest pa *]
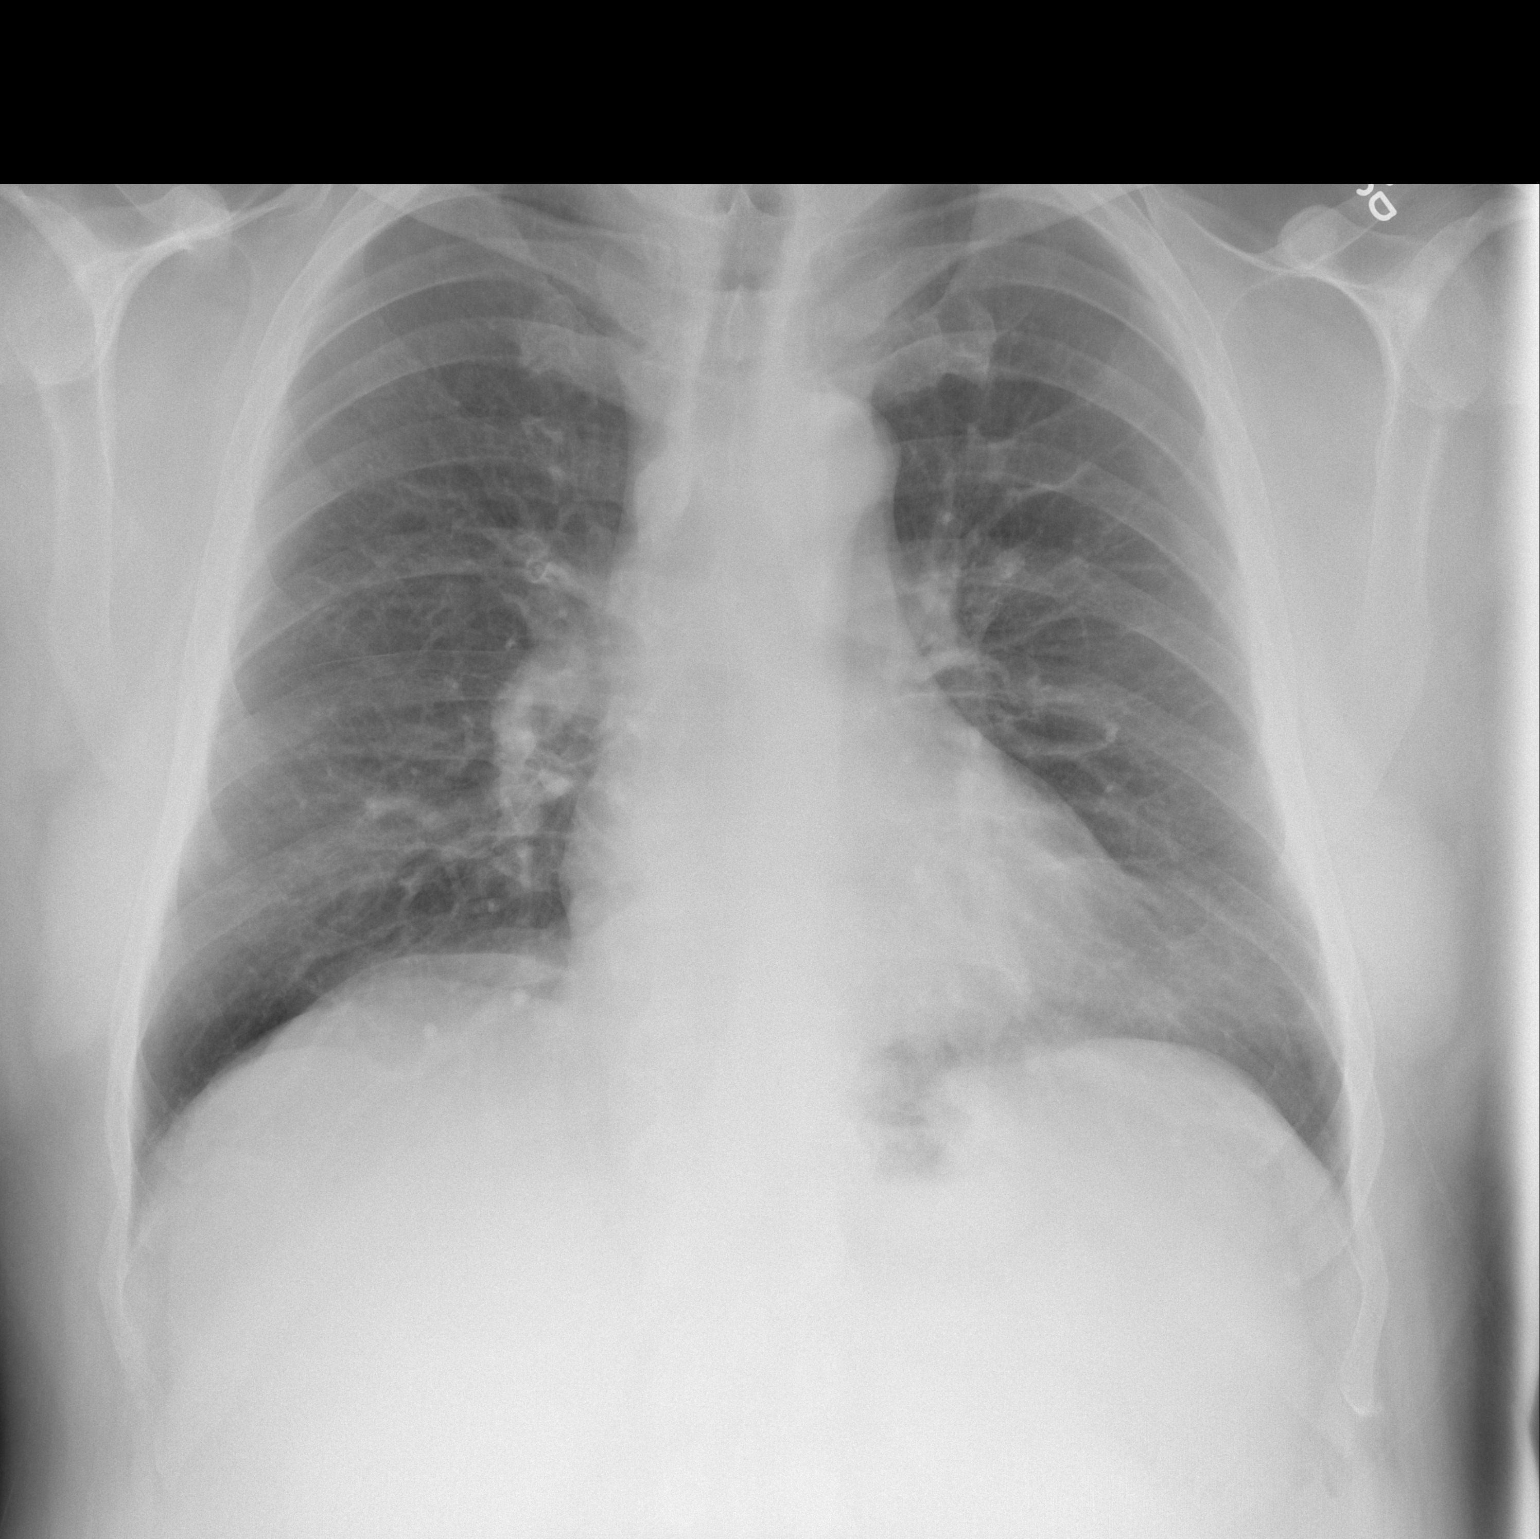

[w chest lat *]
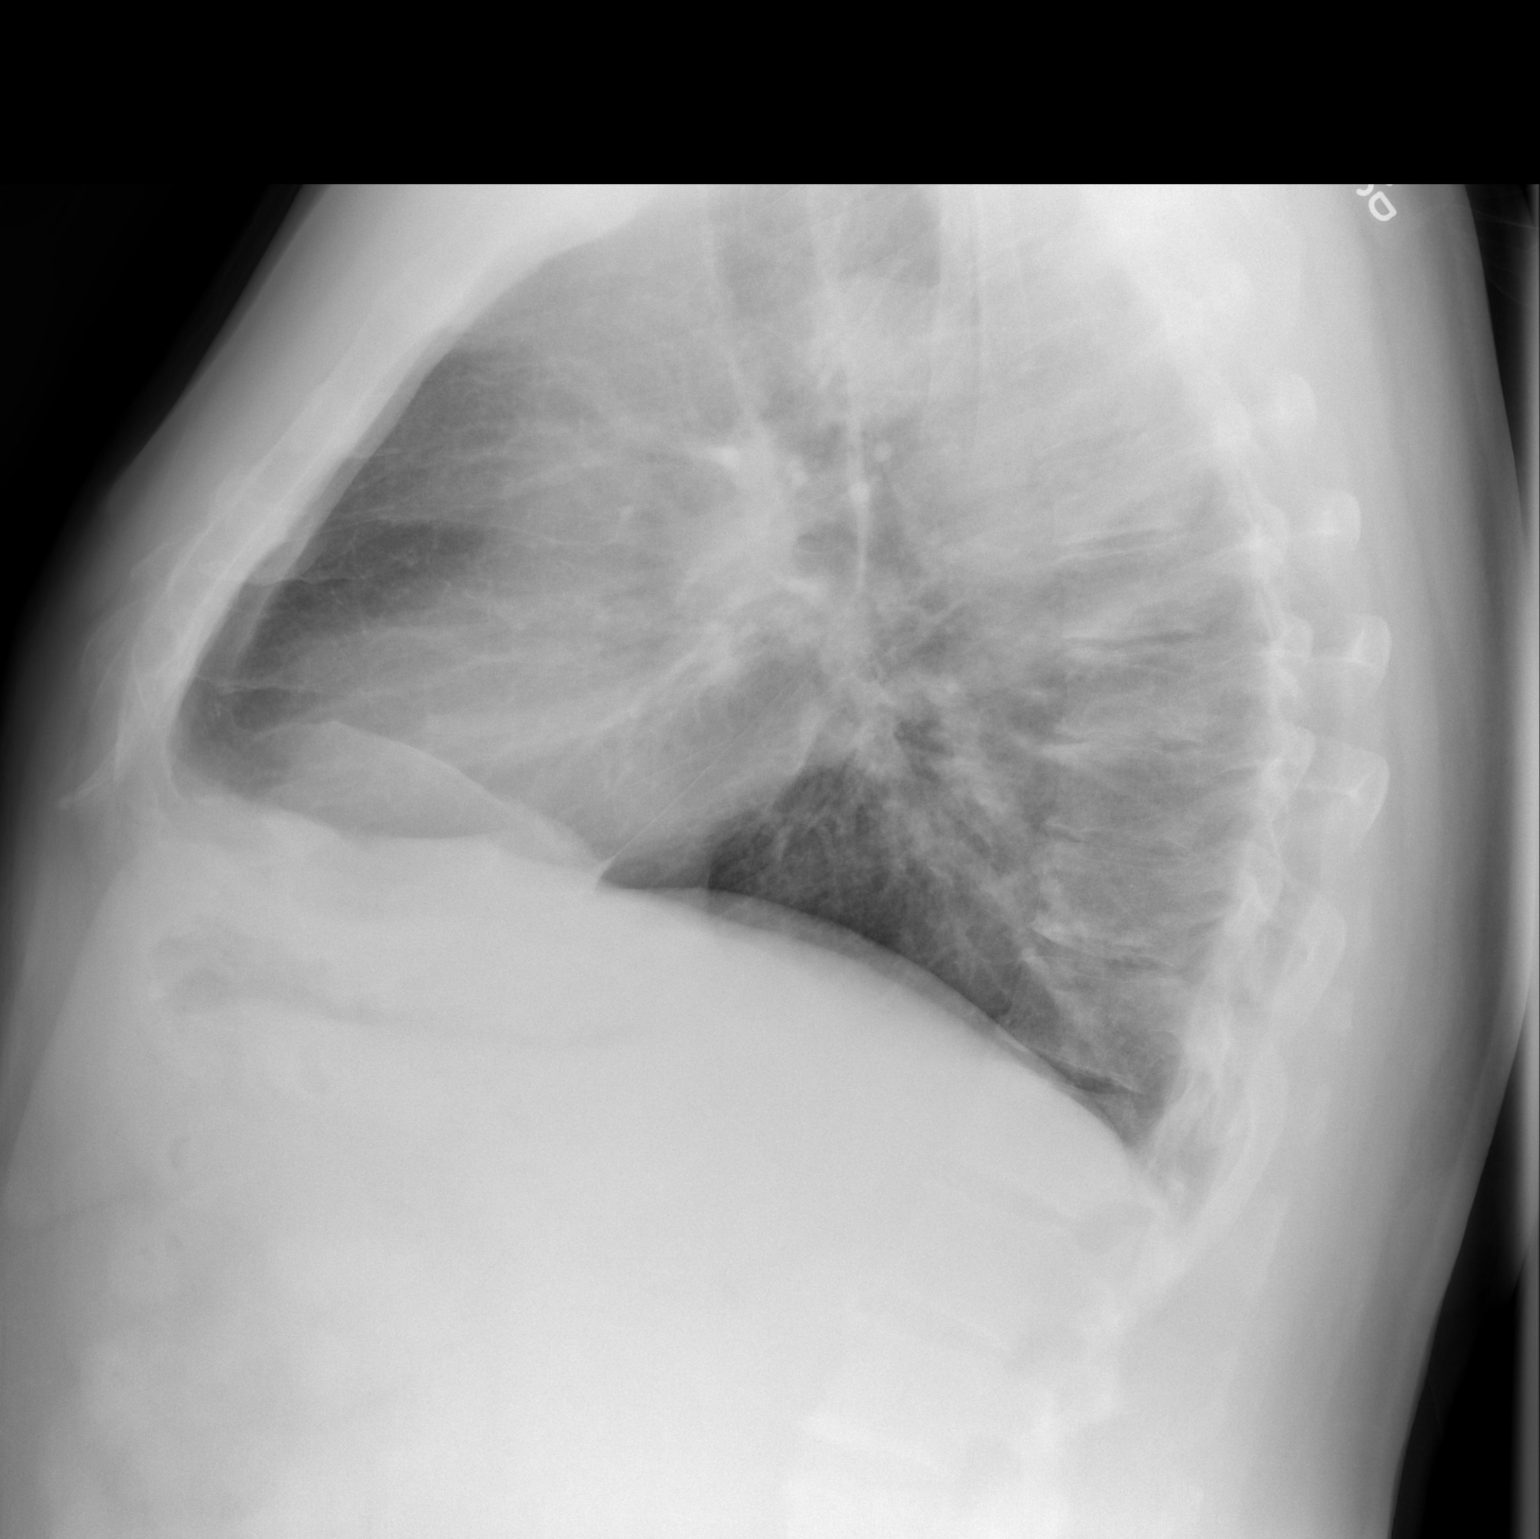

[2 of 2 positions shown; findings below may reference images not displayed]

FINDINGS: Normal cardiac silhouette and mediastinal contours. Apparent mild
lung hyperexpansion may be artifactual secondary to mildly
accentuated thoracic kyphosis. Minimal bilateral infrahilar
opacities favored to represent atelectasis. No discrete focal
airspace opacities. No definite pleural effusion or pneumothorax. No
definite evidence of edema. Mild-to-moderate multilevel DDD within
the mid and caudal aspect of the thoracic spine.
IMPRESSION: Minimal perihilar atelectasis without acute cardiopulmonary disease.

## 2016-08-31 DEATH — deceased
